# Patient Record
Sex: Male | Born: 2005 | Race: Black or African American | Hispanic: No | Marital: Single | Smoking: Never smoker
Health system: Southern US, Community
[De-identification: ages and names within clinical notes are randomized; demographics above are authoritative.]

---
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
## 2024-09-21 ENCOUNTER — Ambulatory Visit
Admission: EM | Admit: 2024-09-21 | Discharge: 2024-09-21 | Disposition: A | Discharge status: Home / Self Care | Attending: Family Medicine | Admitting: Family Medicine

## 2024-09-21 DIAGNOSIS — R42 Dizziness and giddiness: Secondary | ICD-10-CM | POA: Diagnosis not present

## 2024-09-21 MED ORDER — MECLIZINE HCL 12.5 MG PO TABS: 12.5000 mg | ORAL_TABLET | Freq: Three times a day (TID) | ORAL | 0 refills | Status: AC | PRN

## 2024-09-21 MED ORDER — DIAZEPAM 5 MG PO TABS: ORAL_TABLET | ORAL | 0 refills | Status: AC

## 2024-09-21 NOTE — ED Provider Notes (Signed)
 " [facility]  Note:  This document was prepared using Conservation officer, historic buildings and may include unintentional dictation errors.  MRN: [id number] DOB: [birth year]  Subjective:   Mason Diaz is a 18 y.o. male presenting for an episode of dizziness, vertigo, lightheadedness and slight nausea about 1 hour prior to arrival in clinic. Symptoms started while in the shower. No confusion, weakness, vision changes, respiratory symptoms, chest pain, shob, wheezing, vomiting, belly pain, changes to bowel or urinary habits. Has eaten today, has had ~2 bottles of water today. No history of arrhythmias, neurologic conditions. No smoking of any kind including cigarettes, cigars, vaping, marijuana use.  No alcohol use.  Patient's symptoms did improve well with the use of meclizine .  Reports that multiple family members and his family suffer from vertigo.  Current Outpatient Medications  Medication Instructions   meclizine  (ANTIVERT ) 12.5 mg, 3 times daily PRN    Allergies[1]  History reviewed. No pertinent past medical history.   History reviewed. No pertinent surgical history.  Family History  Problem Relation Age of Onset   Diabetes Father    Diabetes Maternal Grandfather    Hypertension Maternal Grandfather     Social History   Occupational History   Not on file  Tobacco Use   Smoking status: Never   Smokeless tobacco: Never  Substance and Sexual Activity   Alcohol use: Never   Drug use: Never   Sexual activity: Never     ROS   Objective:   Vitals: BP 126/83 (BP Location: Right Arm)   Pulse 79   Temp 98.6 F (37 C) (Oral)   Resp 16   SpO2 97%   Physical Exam Constitutional:      General: He is not in acute distress.    Appearance: Normal appearance. He is well-developed and normal weight. He is not ill-appearing, toxic-appearing or diaphoretic.  HENT:     Head: Normocephalic and atraumatic.     Right Ear: Tympanic membrane, ear canal  and external ear normal. There is no impacted cerumen.     Left Ear: Tympanic membrane, ear canal and external ear normal. There is no impacted cerumen.     Nose: Nose normal.     Mouth/Throat:     Mouth: Mucous membranes are moist.     Pharynx: No pharyngeal swelling, oropharyngeal exudate, posterior oropharyngeal erythema or uvula swelling.     Tonsils: No tonsillar exudate or tonsillar abscesses. 0 on the right. 0 on the left.  Eyes:     General: No scleral icterus.       Right eye: No discharge.        Left eye: No discharge.     Extraocular Movements: Extraocular movements intact.     Conjunctiva/sclera: Conjunctivae normal.     Pupils: Pupils are equal, round, and reactive to light.  Neck:     Vascular: No carotid bruit.     Meningeal: Brudzinski's sign and Kernig's sign absent.  Cardiovascular:     Rate and Rhythm: Normal rate and regular rhythm.     Heart sounds: Normal heart sounds. No murmur heard.    No friction rub. No gallop.  Pulmonary:     Effort: Pulmonary effort is normal. No respiratory distress.     Breath sounds: Normal breath sounds. No stridor. No wheezing, rhonchi or rales.  Musculoskeletal:     Cervical back: Normal range of motion and neck supple. No rigidity.  Neurological:     Mental Status: He is  alert and oriented to person, place, and time.     Cranial Nerves: No cranial nerve deficit, dysarthria or facial asymmetry.     Motor: No weakness, abnormal muscle tone or pronator drift.     Coordination: Romberg sign negative. Coordination normal. Finger-Nose-Finger Test and Heel to [facility] Test normal. Rapid alternating movements normal.     Gait: Gait normal.     Deep Tendon Reflexes: Reflexes normal.  Psychiatric:        Mood and Affect: Mood normal.        Behavior: Behavior normal.        Thought Content: Thought content normal.        Judgment: Judgment normal.     Assessment and Plan :   I have reviewed the PDMP during this encounter.  1.  Vertigo      Offered diazepam  and meclizine .  Discussed general management of vertigo.  Counseled patient on potential for adverse effects with medications prescribed/recommended today, ER and return-to-clinic precautions discussed, patient verbalized understanding.     [1] No Known Allergies    Christopher Savannah, PA-C 09/21/24 1551  "

## 2024-09-21 NOTE — ED Triage Notes (Signed)
 Pt reports he felt nauseous and lightheaded and dizzy 1 hr ago. Meclizine give some relief.

## 2024-09-22 ENCOUNTER — Emergency Department (HOSPITAL_COMMUNITY)
Admission: EM | Admit: 2024-09-22 | Discharge: 2024-09-23 | Disposition: A | Discharge status: Home / Self Care | Attending: Emergency Medicine | Admitting: Emergency Medicine

## 2024-09-22 ENCOUNTER — Other Ambulatory Visit: Payer: Self-pay

## 2024-09-22 DIAGNOSIS — G43909 Migraine, unspecified, not intractable, without status migrainosus: Secondary | ICD-10-CM | POA: Diagnosis not present

## 2024-09-22 DIAGNOSIS — R519 Headache, unspecified: Secondary | ICD-10-CM | POA: Diagnosis present

## 2024-09-22 MED ORDER — ACETAMINOPHEN 500 MG PO TABS
1000.0000 mg | ORAL_TABLET | Freq: Once | ORAL | Status: AC
  Administered 2024-09-22: 1000 mg via ORAL
  Filled 2024-09-22: qty 2

## 2024-09-22 MED ORDER — METOCLOPRAMIDE HCL 10 MG PO TABS
10.0000 mg | ORAL_TABLET | Freq: Once | ORAL | Status: AC
  Administered 2024-09-22: 10 mg via ORAL
  Filled 2024-09-22: qty 1

## 2024-09-22 MED ORDER — KETOROLAC TROMETHAMINE 15 MG/ML IJ SOLN
15.0000 mg | Freq: Once | INTRAMUSCULAR | Status: AC
  Administered 2024-09-22: 15 mg via INTRAMUSCULAR
  Filled 2024-09-22: qty 1

## 2024-09-22 NOTE — ED Provider Notes (Incomplete)
 " [place] EMERGENCY DEPARTMENT AT [facility] Provider Note   CSN: [id number] Arrival date & time: 09/22/24  8094     Patient presents with: Headache   Mason Diaz is a 18 y.o. male.  presents to the ED with symptoms of headache started yesterday.  He describes the headache as left-sided frontal as tight and has been constant since yesterday.  Patient also notes tingling in his extremities and blurry vision. He was seen at the urgent care yesterday for symptoms where he was prescribed meclizine .  Patient states that he took meclizine  and Advil  without relief.  He denies alcohol or drug use.  He denies history of stroke, vertigo, or any other medical problems.  Patient's mother does have a history of vertigo.  Patient denies fevers, dizziness, nausea, vomiting, diarrhea, confusion, weakness, chest pain, numbness, shortness of breath, neck pain, back pain, tinnitus, numbness, photophobia, hearing difficulties, abdominal pain or any other symptoms at this time.  He did obtain a CMP and magnesium level today which was negative.  Denies recent viral infection.     {Add pertinent medical, surgical, social history, OB history to HPI:32947}  Headache      Prior to Admission medications  Medication Sig Start Date End Date Taking? Authorizing Provider  diazepam  (VALIUM ) 5 MG tablet Take 1 tablet daily as needed for vertigo. 09/21/24   Christopher Savannah, PA-C  meclizine  (ANTIVERT ) 12.5 MG tablet Take 1 tablet (12.5 mg total) by mouth 3 (three) times daily as needed for dizziness. 09/21/24   Christopher Savannah, PA-C    Allergies: Patient has no known allergies.    Review of Systems  Eyes:  Positive for visual disturbance.  Neurological:  Positive for headaches.    Updated Vital Signs BP 135/68 (BP Location: Right Arm)   Pulse 82   Temp 98.7 F (37.1 C) (Oral)   Resp 17   SpO2 100%   Physical Exam Vitals and nursing note reviewed.  Constitutional:      General: He is not in  acute distress.    Appearance: He is well-developed.  HENT:     Head: Normocephalic and atraumatic.  Eyes:     Conjunctiva/sclera: Conjunctivae normal.  Cardiovascular:     Rate and Rhythm: Normal rate and regular rhythm.     Heart sounds: No murmur heard. Pulmonary:     Effort: Pulmonary effort is normal. No respiratory distress.     Breath sounds: Normal breath sounds.  Abdominal:     Palpations: Abdomen is soft.     Tenderness: There is no abdominal tenderness.  Musculoskeletal:        General: No swelling.     Cervical back: Neck supple.  Skin:    General: Skin is warm and dry.     Capillary Refill: Capillary refill takes less than 2 seconds.  Neurological:     General: No focal deficit present.     Mental Status: He is alert and oriented to person, place, and time.     Cranial Nerves: Cranial nerves 2-12 are intact.     Sensory: Sensation is intact.     Motor: Motor function is intact.     Coordination: Coordination is intact.     Gait: Gait is intact.     Deep Tendon Reflexes: Reflexes are normal and symmetric.     Comments: Speech is clear and goal oriented, follows commands Major Cranial nerves without deficit, no facial droop Normal strength in upper and lower extremities bilaterally including dorsiflexion and  plantar flexion, strong and equal grip strength Sensation normal to light and sharp touch Moves extremities without ataxia, coordination intact Normal finger to nose and rapid alternating movements Neg romberg, no pronator drift Normal gait Normal heel-shin and balance   Psychiatric:        Mood and Affect: Mood normal.     (all labs ordered are listed, but only abnormal results are displayed) Labs Reviewed - No data to display  EKG: None  Radiology: No results found.  {Document cardiac monitor, telemetry assessment procedure when appropriate:32947} Procedures   Medications Ordered in the ED  ketorolac  (TORADOL ) 15 MG/ML injection 15 mg (15 mg  Intramuscular Given 09/22/24 2343)  metoCLOPramide  (REGLAN ) tablet 10 mg (10 mg Oral Given 09/22/24 2343)  acetaminophen  (TYLENOL ) tablet 1,000 mg (1,000 mg Oral Given 09/22/24 2343)      {Click here for ABCD2, HEART and other calculators REFRESH Note before signing:1}                              Medical Decision Making  ***  This patient presents to the ED for concern of headache and tingling in bilateral extremities, this involves an extensive number of treatment options, and is a complaint that carries with it a high risk of complications and morbidity.  The differential diagnosis includes BPPV, migraine, head trauma, AVM, intracranial tumor, multiple sclerosis, drug-related, CVA, orthostatic hypotension, sepsis, hypoglycemia, electrolyte disturbance, anemia, anxiety, HTN, tension headache, cluster headache, meningitis    Additional history obtained:  Additional history obtained from EMR Reviewed notes from yesterday where he was seen at the urgent care and was prescribed meclizine .  Reviewed CMP and magnesium level which is obtained today and was normal    Problem List / ED Course / Critical interventions / Medication management  Patient presents to the ED with left-sided headache and tingling to extremities that started yesterday.  Neurovascularly intact and no focal deficit on exam.  Provided patient with Tylenol , Toradol , Reglan .  As the patient is neurovascularly intact and has benign physical exam, there is no need for further imaging or labs at this time.  Symptoms are consistent with a migraine.  I reviewed this with the patient and reassured him that that further workup is not necessary at this time.  I recommended patient to take NSAIDS  I ordered medication including Tylenol , Toradol , Reglan  Reevaluation of the patient after these medicines showed that the patient *** I have reviewed the patients home medicines and have made adjustments as needed    {Document critical  care time when appropriate  Document review of labs and clinical decision tools ie CHADS2VASC2, etc  Document your independent review of radiology images and any outside records  Document your discussion with family members, caretakers and with consultants  Document social determinants of health affecting pt's care  Document your decision making why or why not admission, treatments were needed:32947:::1}   Final diagnoses:  None    ED Discharge Orders     None        "

## 2024-09-22 NOTE — ED Provider Notes (Signed)
 " [place] EMERGENCY DEPARTMENT AT [facility] Provider Note   CSN: [id number] Arrival date & time: 09/22/24  8094     Patient presents with: Headache   Mason Diaz is a 18 y.o. male.  presents to the ED with symptoms of headache started yesterday.  He describes the headache as left-sided frontal as tight and has been constant since yesterday.  Patient also notes tingling in his extremities and blurry vision. He was seen at the urgent care yesterday for symptoms where he was prescribed meclizine .  Patient states that he took meclizine  and Advil  without relief.  He denies alcohol or drug use.  He denies history of stroke, vertigo, or any other medical problems.  Patient's mother does have a history of vertigo.  Patient denies fevers, dizziness, nausea, vomiting, diarrhea, confusion, weakness, chest pain, numbness, shortness of breath, syncope, neck pain, back pain, tinnitus, numbness, photophobia, hearing difficulties, abdominal pain or any other symptoms at this time.  He did obtain a CMP and magnesium level today which was negative.  Denies recent viral infection.       Headache      Prior to Admission medications  Medication Sig Start Date End Date Taking? Authorizing Provider  butalbital -acetaminophen -caffeine  (FIORICET) 50-325-40 MG tablet Take 1 tablet by mouth every 6 (six) hours as needed for up to 7 days for headache. 09/23/24 09/30/24  Labron Bloodgood, PA-C  diazepam  (VALIUM ) 5 MG tablet Take 1 tablet daily as needed for vertigo. 09/21/24   Christopher Savannah, PA-C  meclizine  (ANTIVERT ) 12.5 MG tablet Take 1 tablet (12.5 mg total) by mouth 3 (three) times daily as needed for dizziness. 09/21/24   Christopher Savannah, PA-C    Allergies: Patient has no known allergies.    Review of Systems  Eyes:  Positive for visual disturbance.  Neurological:  Positive for headaches.    Updated Vital Signs BP 118/74   Pulse 78   Temp 98 F (36.7 C) (Oral)   Resp 15   SpO2 100%    Physical Exam Vitals and nursing note reviewed.  Constitutional:      General: He is not in acute distress.    Appearance: He is well-developed.  HENT:     Head: Normocephalic and atraumatic.  Eyes:     Conjunctiva/sclera: Conjunctivae normal.  Neck:     Comments: No nuchal rigidity Cardiovascular:     Rate and Rhythm: Normal rate and regular rhythm.     Heart sounds: No murmur heard. Pulmonary:     Effort: Pulmonary effort is normal. No respiratory distress.     Breath sounds: Normal breath sounds.  Abdominal:     Palpations: Abdomen is soft.     Tenderness: There is no abdominal tenderness.  Musculoskeletal:        General: No swelling.     Cervical back: Neck supple.  Skin:    General: Skin is warm and dry.     Capillary Refill: Capillary refill takes less than 2 seconds.  Neurological:     General: No focal deficit present.     Mental Status: He is alert and oriented to person, place, and time.     Cranial Nerves: Cranial nerves 2-12 are intact.     Sensory: Sensation is intact.     Motor: Motor function is intact.     Coordination: Coordination is intact.     Gait: Gait is intact.     Deep Tendon Reflexes: Reflexes are normal and symmetric.     Comments:  Speech is clear and goal oriented, follows commands Major Cranial nerves without deficit, no facial droop Normal strength in upper and lower extremities bilaterally including dorsiflexion and plantar flexion, strong and equal grip strength Sensation normal to light and sharp touch Moves extremities without ataxia, coordination intact Normal finger to nose and rapid alternating movements Neg romberg, no pronator drift Normal gait Normal heel-shin and balance   Psychiatric:        Mood and Affect: Mood normal.     (all labs ordered are listed, but only abnormal results are displayed) Labs Reviewed - No data to display  EKG: None  Radiology: No results found.   Procedures   Medications Ordered in  the ED  ketorolac  (TORADOL ) 15 MG/ML injection 15 mg (15 mg Intramuscular Given 09/22/24 2343)  metoCLOPramide  (REGLAN ) tablet 10 mg (10 mg Oral Given 09/22/24 2343)  acetaminophen  (TYLENOL ) tablet 1,000 mg (1,000 mg Oral Given 09/22/24 2343)                                    Medical Decision Making Risk OTC drugs. Prescription drug management.    This patient presents to the ED for concern of headache and tingling in bilateral extremities, this involves an extensive number of treatment options, and is a complaint that carries with it a high risk of complications and morbidity.  The differential diagnosis includes BPPV, migraine, head trauma, AVM, intracranial tumor, multiple sclerosis, drug-related, CVA, orthostatic hypotension, sepsis, hypoglycemia, electrolyte disturbance, anemia, anxiety, HTN, tension headache, cluster headache, meningitis    Additional history obtained:  Additional history obtained from EMR Reviewed notes from yesterday where he was seen at the urgent care and was prescribed meclizine .  Reviewed CMP and magnesium level which is obtained today and was normal    Problem List / ED Course / Critical interventions / Medication management  Patient presents to the ED with left-sided headache and tingling to extremities that started yesterday.  Neurovascularly intact and no focal deficit on exam.  Vital signs stable. Provided patient with Tylenol , Toradol , Reglan  which improved his symptoms.  As the patient is neurovascularly intact and has benign physical exam, there is no need for further imaging or labs at this time.  Symptoms are consistent with a migraine.  I reviewed this with the patient and reassured him that that further workup is not necessary at this time.  I recommended patient to take Tylenol  or ibuprofen  as needed.  Also ordered Fioricet for severe headache per patient request.  Recommended patient to return if he is profusely vomiting, severe headache, neck  stiffness, fevers, dizziness. Patient is in agreement with plan and is stable for discharge.  I ordered medication including Tylenol , Toradol , Reglan .  Reevaluation of the patient after these medicines showed that the patient improved significantly after receiving the medications I have reviewed the patients home medicines and have made adjustments as needed       Final diagnoses:  Migraine without status migrainosus, not intractable, unspecified migraine type    ED Discharge Orders          Ordered    butalbital -acetaminophen -caffeine  (FIORICET) 50-325-40 MG tablet  Every 6 hours PRN,   Status:  Discontinued        09/23/24 0100    butalbital -acetaminophen -caffeine  (FIORICET) 50-325-40 MG tablet  Every 6 hours PRN        09/23/24 0109  Braxton Dubois, PA-C 09/23/24 0145    Mannie Pac T, DO 09/23/24 [phone]  "

## 2024-09-22 NOTE — ED Triage Notes (Signed)
 Pt reports tightness in his head, lightheaded, and tingling in bilateral extremities.

## 2024-09-23 MED ORDER — BUTALBITAL-APAP-CAFFEINE 50-325-40 MG PO TABS: 1.0000 | ORAL_TABLET | Freq: Four times a day (QID) | ORAL | 0 refills | Status: DC | PRN

## 2024-09-23 MED ORDER — BUTALBITAL-APAP-CAFFEINE 50-325-40 MG PO TABS: 1.0000 | ORAL_TABLET | Freq: Four times a day (QID) | ORAL | 0 refills | Status: AC | PRN

## 2024-09-23 NOTE — Discharge Instructions (Addendum)
 You may take 650 mg to 1,000 mg of Tylenol  every 6 to 8 hours, respectively; or 600 mg to 800 mg of Motrin  every 6 to 8 hours, respectively.  You may alternate Tylenol  and Motrin  every 4 hours if needed. Do not exceed 4,000 mg of Tylenol  or 3,200 mg of Motrin  per day as this can cause organ damage. Drink plenty of fluids.   Only take Fioricet as needed if symptoms are severe.  Return for worsening headaches, confusion, vomiting, shortness of breath, dizziness.
